# Patient Record
Sex: Male | Born: 1985 | Race: White | Hispanic: Yes | Marital: Married
Health system: Southern US, Community
[De-identification: ages and names within clinical notes are randomized; demographics above are authoritative.]

---

## 2020-07-18 ENCOUNTER — Emergency Department (HOSPITAL_COMMUNITY)
Admission: EM | Admit: 2020-07-18 | Discharge: 2020-07-19 | Disposition: A | Discharge status: Home / Self Care | Payer: Self-pay | Attending: Emergency Medicine | Admitting: Emergency Medicine

## 2020-07-18 ENCOUNTER — Encounter (HOSPITAL_COMMUNITY): Payer: Self-pay | Admitting: Emergency Medicine

## 2020-07-18 ENCOUNTER — Other Ambulatory Visit: Payer: Self-pay

## 2020-07-18 DIAGNOSIS — N23 Unspecified renal colic: Secondary | ICD-10-CM

## 2020-07-18 DIAGNOSIS — N201 Calculus of ureter: Secondary | ICD-10-CM | POA: Insufficient documentation

## 2020-07-18 DIAGNOSIS — N1330 Unspecified hydronephrosis: Secondary | ICD-10-CM | POA: Insufficient documentation

## 2020-07-18 LAB — LIPASE, BLOOD: Lipase: 20 U/L (ref 11–51)

## 2020-07-18 LAB — COMPREHENSIVE METABOLIC PANEL
ALT: 34 U/L (ref 0–44)
AST: 25 U/L (ref 15–41)
Albumin: 4.1 g/dL (ref 3.5–5.0)
Alkaline Phosphatase: 77 U/L (ref 38–126)
Anion gap: 13 (ref 5–15)
BUN: 15 mg/dL (ref 6–20)
CO2: 24 mmol/L (ref 22–32)
Calcium: 9.8 mg/dL (ref 8.9–10.3)
Chloride: 103 mmol/L (ref 98–111)
Creatinine, Ser: 1.25 mg/dL — ABNORMAL HIGH (ref 0.61–1.24)
GFR calc Af Amer: >60 mL/min (ref >60)
GFR calc non Af Amer: >60 mL/min (ref >60)
Glucose, Bld: 111 mg/dL — ABNORMAL HIGH (ref 70–99)
Potassium: 4 mmol/L (ref 3.5–5.1)
Sodium: 140 mmol/L (ref 135–145)
Total Bilirubin: 0.6 mg/dL (ref 0.3–1.2)
Total Protein: 7.5 g/dL (ref 6.5–8.1)

## 2020-07-18 LAB — CBC
HCT: 43.5 % (ref 39.0–52.0)
Hemoglobin: 14.2 g/dL (ref 13.0–17.0)
MCH: 29.2 pg (ref 26.0–34.0)
MCHC: 32.6 g/dL (ref 30.0–36.0)
MCV: 89.3 fL (ref 80.0–100.0)
Platelets: 329 10*3/uL (ref 150–400)
RBC: 4.87 MIL/uL (ref 4.22–5.81)
RDW: 13.3 % (ref 11.5–15.5)
WBC: 13.1 10*3/uL — ABNORMAL HIGH (ref 4.0–10.5)
nRBC: 0 % (ref 0.0–0.2)

## 2020-07-18 NOTE — ED Triage Notes (Signed)
Pt reports right sided abdominal pain X2 days.  Pt also reports painful urination, denies n/v/d/fevers.

## 2020-07-19 ENCOUNTER — Emergency Department (HOSPITAL_COMMUNITY): Payer: Self-pay

## 2020-07-19 LAB — URINALYSIS, ROUTINE W REFLEX MICROSCOPIC
Bilirubin Urine: NEGATIVE
Glucose, UA: 50 mg/dL — AB
Hgb urine dipstick: NEGATIVE
Ketones, ur: NEGATIVE mg/dL
Leukocytes,Ua: NEGATIVE
Nitrite: NEGATIVE
Protein, ur: NEGATIVE mg/dL
Specific Gravity, Urine: 1.028 (ref 1.005–1.030)
pH: 5 (ref 5.0–8.0)

## 2020-07-19 MED ORDER — TAMSULOSIN HCL 0.4 MG PO CAPS: 0.4000 mg | ORAL_CAPSULE | Freq: Every day | ORAL | 0 refills | Status: AC

## 2020-07-19 MED ORDER — IBUPROFEN 600 MG PO TABS: 600.0000 mg | ORAL_TABLET | Freq: Four times a day (QID) | ORAL | 0 refills | Status: AC | PRN

## 2020-07-19 MED ORDER — OXYCODONE-ACETAMINOPHEN 5-325 MG PO TABS: 1.0000 | ORAL_TABLET | Freq: Four times a day (QID) | ORAL | 0 refills | Status: AC | PRN

## 2020-07-19 MED ORDER — OXYCODONE-ACETAMINOPHEN 5-325 MG PO TABS
1.0000 | ORAL_TABLET | Freq: Once | ORAL | Status: AC
  Administered 2020-07-19: 1 via ORAL
  Filled 2020-07-19: qty 1

## 2020-07-19 NOTE — Discharge Instructions (Addendum)
Please read and follow all provided instructions.  Your diagnoses today include:  1. Ureteral colic   2. Right ureteral stone     Tests performed today include:  Urine test that did not show blood in your urine and no infection  CT scan which showed a 3 millimeter kidney on the right side  Blood test that showed just slightly weak kidney function  Vital signs. See below for your results today.   Medications prescribed:   Percocet (oxycodone/acetaminophen) - narcotic pain medication  DO NOT drive or perform any activities that require you to be awake and alert because this medicine can make you drowsy. BE VERY CAREFUL not to take multiple medicines containing Tylenol (also called acetaminophen). Doing so can lead to an overdose which can damage your liver and cause liver failure and possibly death.   Ibuprofen (Motrin, Advil) - anti-inflammatory pain medication  Do not exceed 600mg  ibuprofen every 6 hours, take with food  You have been prescribed an anti-inflammatory medication or NSAID. Take with food. Take smallest effective dose for the shortest duration needed for your pain. Stop taking if you experience stomach pain or vomiting.    Flomax (tamsulosin) - relaxes smooth muscle to help kidney stones pass  Take any prescribed medications only as directed.  Home care instructions:  Follow any educational materials contained in this packet.  Please double your fluid intake for the next several days. Strain your urine and save any stones that may pass.   BE VERY CAREFUL not to take multiple medicines containing Tylenol (also called acetaminophen). Doing so can lead to an overdose which can damage your liver and cause liver failure and possibly death.   Follow-up instructions: Please follow-up with your urologist or the urologist referral (provided on front page) in the next 1 week for further evaluation of your symptoms.  Return instructions:  If you need to return to the  Emergency Department, go to [facility] and not [facility]. The urologists are located at [facility] and can better care for you at this location.   Please return to the Emergency Department if you experience worsening symptoms.  Please return if you develop fever or uncontrolled pain or vomiting.  Please return if you have any other emergent concerns.  Additional Information:  Your vital signs today were: BP (!) 143/81 (BP Location: Left Arm)   Pulse 82   Temp 98.3 F (36.8 C) (Oral)   Resp 14   SpO2 99%  If your blood pressure (BP) was elevated above 135/85 this visit, please have this repeated by your doctor within one month. --------------

## 2020-07-19 NOTE — ED Provider Notes (Signed)
[facility] EMERGENCY DEPARTMENT Provider Note   CSN: [id number] Arrival date & time: 07/18/20  2004     History Chief Complaint  Patient presents with  . Abdominal Pain    Thomas Vincent is a 34 y.o. male.  Patient with no significant PMH or surgical history -- presents with a chief complaint of right-sided abdominal pain. Pt states the pain began on Sunday afternoon (today is Tuesday) and has been bothering him consistently ever since its onset. The pain is described as sharp with radiation to the right inguinal region and testicle. He also complains of some mild pain with urination and states his urine has fluctuated between a clear and a yellow color. He has taken acetaminophen for pain relief, which helps temporarily, but the pain returns shortly after. He does not use any other medications. Denies frequency, urgency, penile discharge, recent sexual activity, diarrhea, constipation, pain with defecation, N/V, or fever.  No CP or SOB.  Denies injury. Interpretation using video interpreter and wife at bedside.         History reviewed. No pertinent past medical history.  There are no problems to display for this patient.   The histories are not reviewed yet. Please review them in the "History" navigator section and refresh this SmartLink.     History reviewed. No pertinent family history.  Social History   Tobacco Use  . Smoking status: Not on file  Substance Use Topics  . Alcohol use: Not on file  . Drug use: Not on file    Home Medications Prior to Admission medications   Not on File    Allergies    Patient has no known allergies.  Review of Systems   Review of Systems  Constitutional: Negative for fever.  HENT: Negative for rhinorrhea and sore throat.   Eyes: Negative for redness.  Respiratory: Negative for cough.   Cardiovascular: Negative for chest pain.  Gastrointestinal: Positive for abdominal pain. Negative for diarrhea, nausea  and vomiting.  Genitourinary: Positive for flank pain and testicular pain. Negative for dysuria and hematuria.  Musculoskeletal: Negative for myalgias.  Skin: Negative for rash.  Neurological: Negative for headaches.    Physical Exam Updated Vital Signs BP (!) 143/81 (BP Location: Left Arm)   Pulse 82   Temp 98.3 F (36.8 C) (Oral)   Resp 14   SpO2 99%   Physical Exam Vitals and nursing note reviewed.  Constitutional:      Appearance: He is well-developed.  HENT:     Head: Normocephalic and atraumatic.  Eyes:     General:        Right eye: No discharge.        Left eye: No discharge.     Conjunctiva/sclera: Conjunctivae normal.  Cardiovascular:     Rate and Rhythm: Normal rate and regular rhythm.     Heart sounds: Normal heart sounds.  Pulmonary:     Effort: Pulmonary effort is normal.     Breath sounds: Normal breath sounds.  Abdominal:     Palpations: Abdomen is soft.     Tenderness: There is abdominal tenderness (R lower quadrant and R flank) in the right lower quadrant. There is right CVA tenderness. There is no left CVA tenderness, guarding or rebound.  Musculoskeletal:     Cervical back: Normal range of motion and neck supple.  Skin:    General: Skin is warm and dry.  Neurological:     Mental Status: He is alert.  ED Results / Procedures / Treatments   Labs (all labs ordered are listed, but only abnormal results are displayed) Labs Reviewed  COMPREHENSIVE METABOLIC PANEL - Abnormal; Notable for the following components:      Result Value   Glucose, Bld 111 (*)    Creatinine, Ser 1.25 (*)    All other components within normal limits  CBC - Abnormal; Notable for the following components:   WBC 13.1 (*)    All other components within normal limits  URINALYSIS, ROUTINE W REFLEX MICROSCOPIC - Abnormal; Notable for the following components:   Glucose, UA 50 (*)    All other components within normal limits  URINE CULTURE  LIPASE, BLOOD    EKG   None  Radiology CT Renal Stone Study  Result Date: 07/19/2020 CLINICAL DATA:  Right flank pain 2 days EXAM: CT ABDOMEN AND PELVIS WITHOUT CONTRAST TECHNIQUE: Multidetector CT imaging of the abdomen and pelvis was performed following the standard protocol without IV contrast. COMPARISON:  None. FINDINGS: Lower chest: Mild right lower lobe atelectasis. Left lung base clear. No pleural effusion. Heart size within normal limits. Hepatobiliary: No focal liver abnormality is seen. No gallstones, gallbladder wall thickening, or biliary dilatation. Pancreas: Negative Spleen: Negative Adrenals/Urinary Tract: Mild right hydronephrosis and hydroureter. 3 mm obstructing stone distal right ureter. Normal left kidney. No left renal calculi or obstruction. Bladder wall thickening. Bladder is nearly empty. Stomach/Bowel: Stomach is within normal limits. Appendix appears normal. No evidence of bowel wall thickening, distention, or inflammatory changes. Vascular/Lymphatic: Negative Reproductive: Normal prostate size Other: No free fluid. Musculoskeletal: Negative for hernia. No significant skeletal abnormality. IMPRESSION: 3 mm obstructing stone distal right ureter with mild hydronephrosis and hydroureter. No other renal calculi. Electronically Signed   By: Marlan Palau M.D.   On: 07/19/2020 13:16    Procedures Procedures (including critical care time)  Medications Ordered in ED Medications  oxyCODONE-acetaminophen (PERCOCET/ROXICET) 5-325 MG per tablet 1 tablet (1 tablet Oral Given 07/19/20 1252)    ED Course  I have reviewed the triage vital signs and the nursing notes.  Pertinent labs & imaging results that were available during my care of the patient were reviewed by me and considered in my medical decision making (see chart for details).  Patient seen and examined. Work-up initiated. Medications ordered.   Vital signs reviewed and are as follows: BP (!) 143/81 (BP Location: Left Arm)   Pulse 82    Temp 98.3 F (36.8 C) (Oral)   Resp 14   SpO2 99%   2:07 PM CT reviewed personally.  Patient updated on results using bedside interpreter, wife also notified of results.  Patient with 3 mm right-sided ureteral stone.  Pain is currently well controlled.  Patient is comfortable with discharge to home.   Patient counseled on kidney stone treatment. Urged patient to strain urine and save any stones. Urged urology follow-up and return to [facility] with any complications. Counseled patient to maintain good fluid intake.   Counseled patient on use of Flomax.   Patient counseled on use of narcotic pain medications. Counseled not to combine these medications with others containing tylenol. Urged not to drink alcohol, drive, or perform any other activities that requires focus while taking these medications. The patient verbalizes understanding and agrees with the plan.    MDM Rules/Calculators/A&P                          Patient with kidney stone, pain consistent with  this.  Pain controlled.  He looks well.  Mild elevation in creatinine.  No UTI.  Mild elevation in white blood cell count, likely secondary to pain.  Plan for discharge home.   Final Clinical Impression(s) / ED Diagnoses Final diagnoses:  Ureteral colic  Right ureteral stone    Rx / DC Orders ED Discharge Orders         Ordered    oxyCODONE-acetaminophen (PERCOCET/ROXICET) 5-325 MG tablet  Every 6 hours PRN        07/19/20 1406    ibuprofen (ADVIL) 600 MG tablet  Every 6 hours PRN        07/19/20 1406    tamsulosin (FLOMAX) 0.4 MG CAPS capsule  Daily        07/19/20 1406           Renne Crigler, PA-C 07/19/20 1409    Pricilla Loveless, MD 07/19/20 1540

## 2020-07-20 LAB — URINE CULTURE: Culture: NO GROWTH

## 2021-03-12 IMAGING — CT CT RENAL STONE PROTOCOL
2 of 3 series · 15 of 42 positions shown, 19 images · non-contrast
Comparison: None.

CLINICAL DATA: Right flank pain 2 days

EXAM:
CT ABDOMEN AND PELVIS WITHOUT CONTRAST
TECHNIQUE: Multidetector CT imaging of the abdomen and pelvis was performed
following the standard protocol without IV contrast.

[Series 3: renal stone 5.0 · axial · 0.98mm/px · z∈[+909,+1334]mm · 12 of 99 slices shown, 16 images]
[im 9/99  soft-tissue]
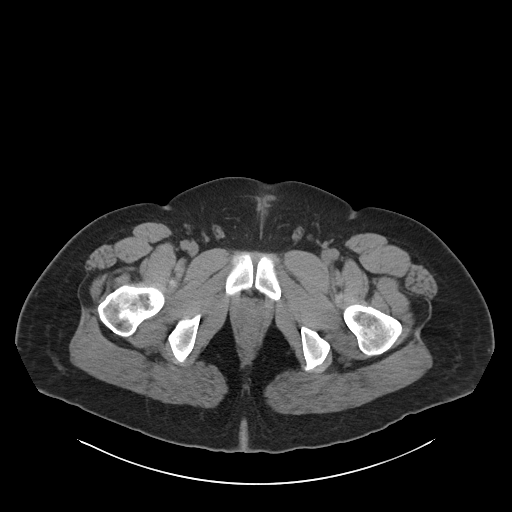
[im 9/99  bone]
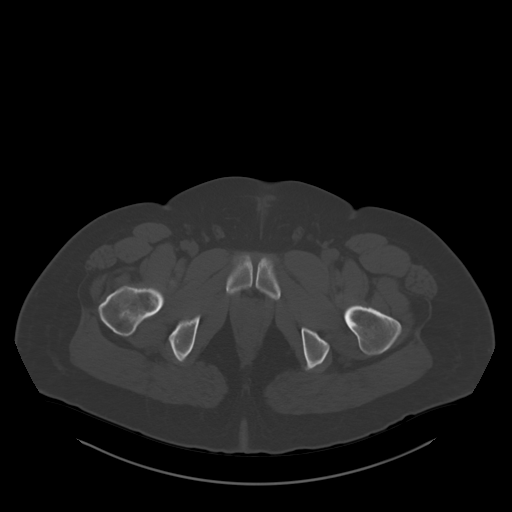
[im 17/99  soft-tissue]
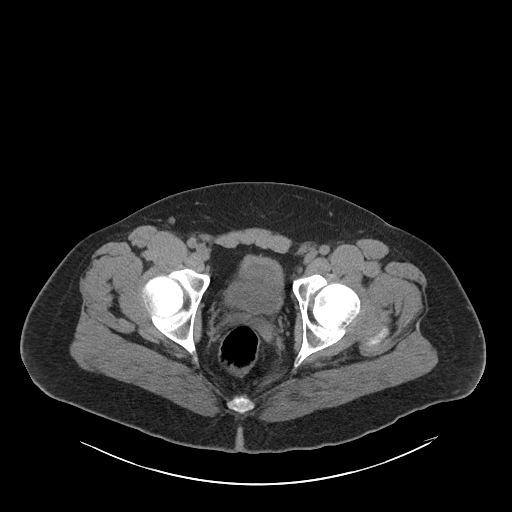
[im 25/99  soft-tissue]
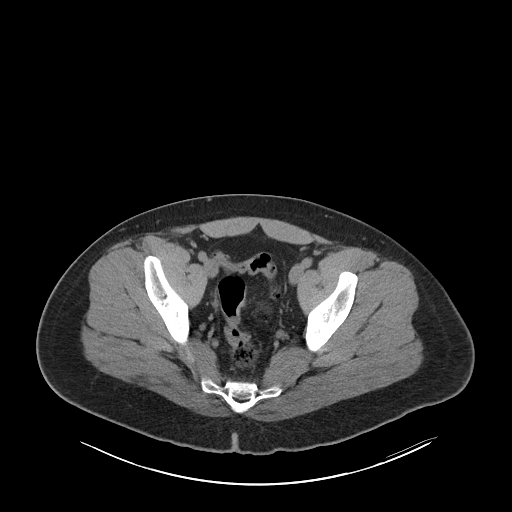
[im 37/99  soft-tissue]
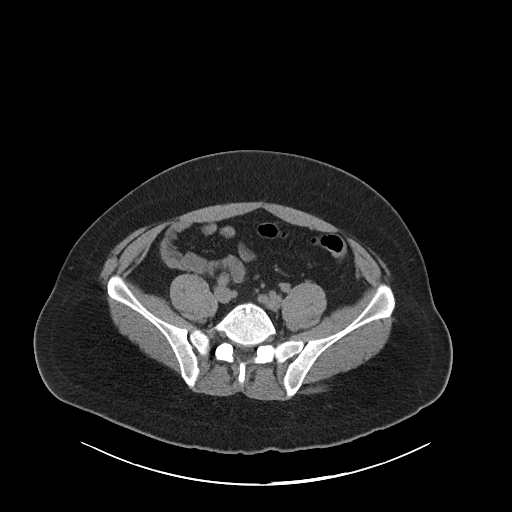
[im 45/99  soft-tissue]
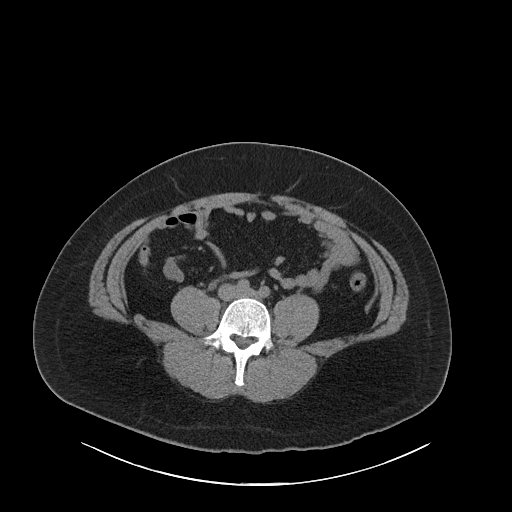
[im 54/99  soft-tissue]
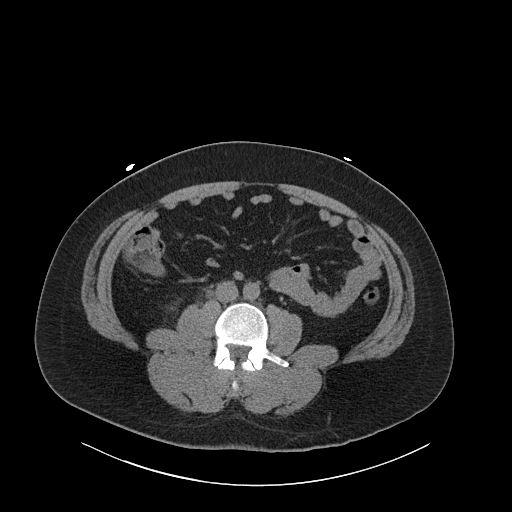
[im 62/99  soft-tissue]
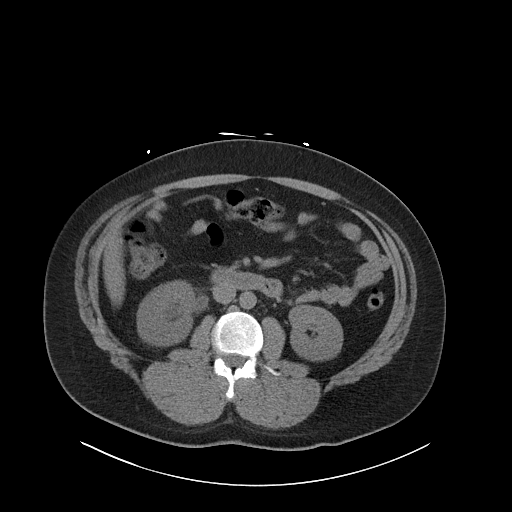
[im 74/99  soft-tissue]
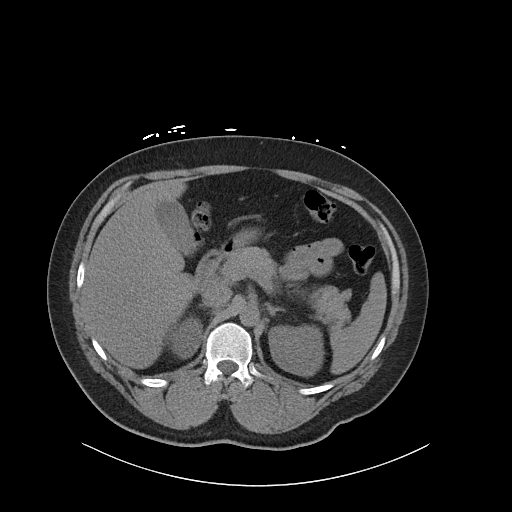
[im 82/99  soft-tissue]
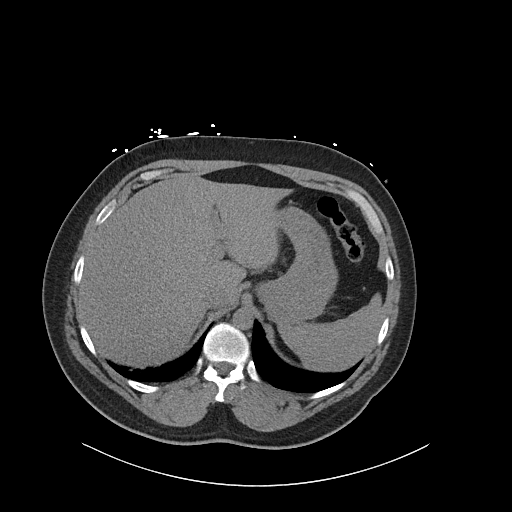
[im 82/99  lung]
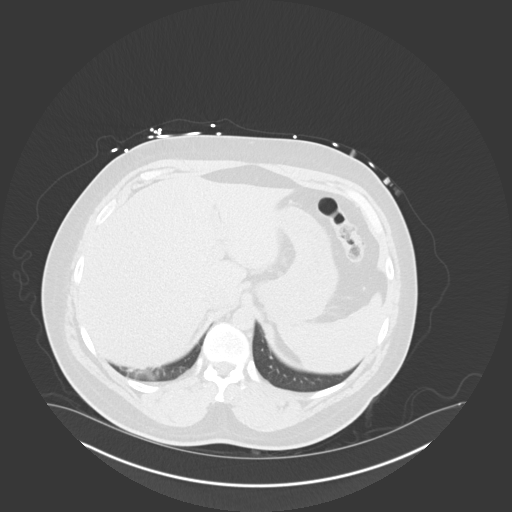
[im 82/99  bone]
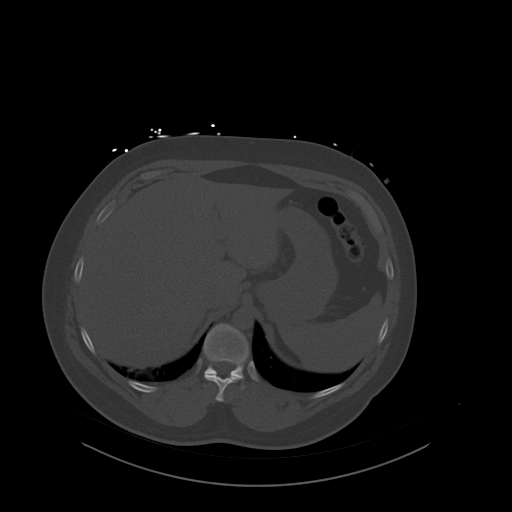
[im 86/99  lung]
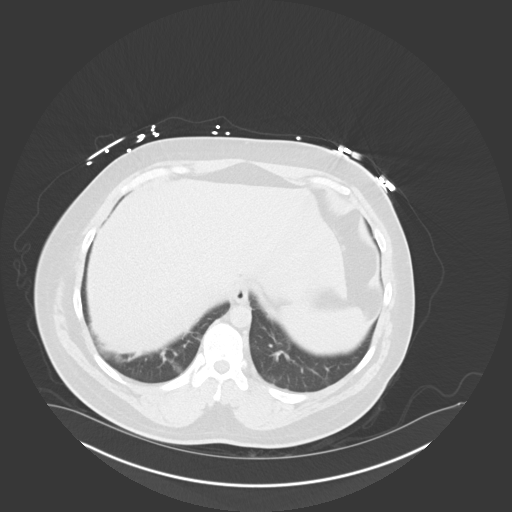
[im 90/99  soft-tissue]
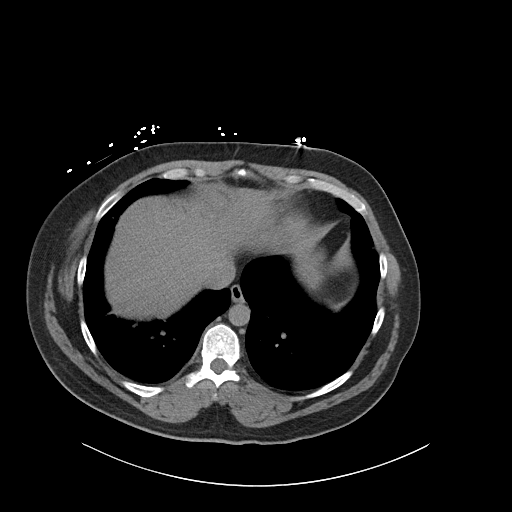
[im 90/99  lung]
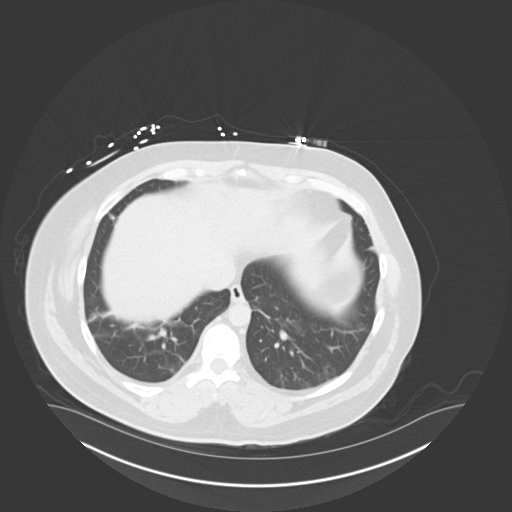
[im 94/99  lung]
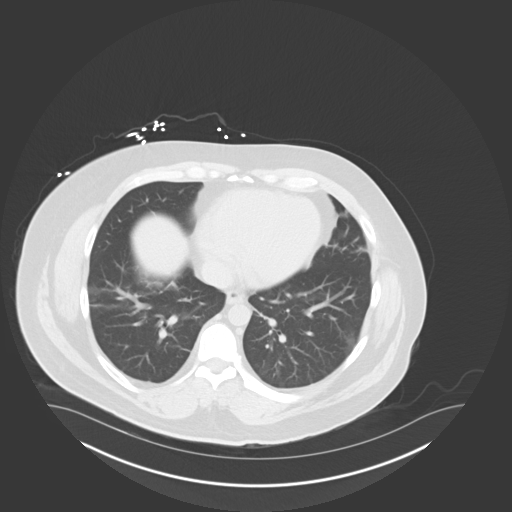

[Series 6: coronal · coronal · 0.87mm/px · 3 of 101 slices shown]
[im 34/101  soft-tissue]
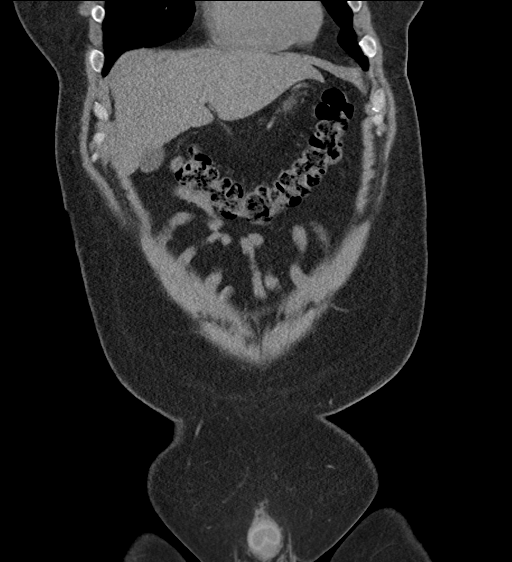
[im 45/101  soft-tissue]
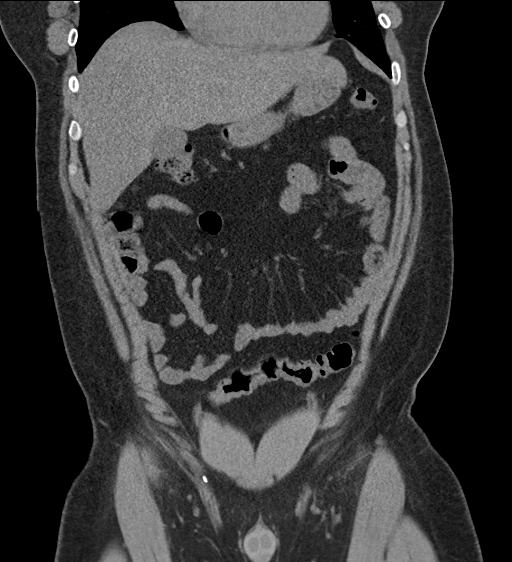
[im 56/101  soft-tissue]
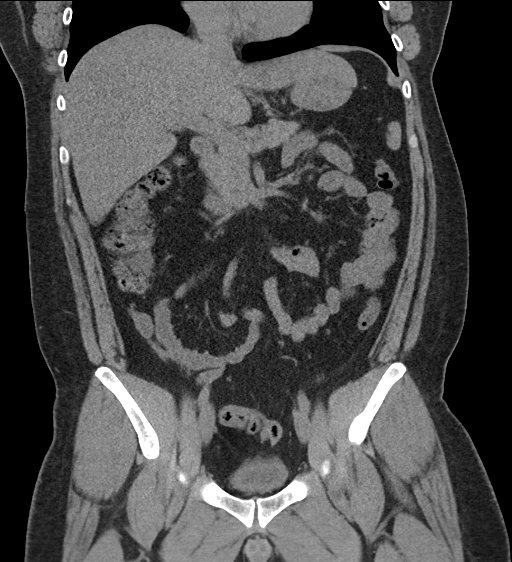

[15 of 42 positions shown; findings below may reference images not displayed]

FINDINGS: Lower chest: Mild right lower lobe atelectasis. Left lung base
clear. No pleural effusion. Heart size within normal limits.

Hepatobiliary: No focal liver abnormality is seen. No gallstones,
gallbladder wall thickening, or biliary dilatation.

Pancreas: Negative

Spleen: Negative

Adrenals/Urinary Tract: Mild right hydronephrosis and hydroureter. 3
mm obstructing stone distal right ureter.

Normal left kidney. No left renal calculi or obstruction. Bladder
wall thickening. Bladder is nearly empty.

Stomach/Bowel: Stomach is within normal limits. Appendix appears
normal. No evidence of bowel wall thickening, distention, or
inflammatory changes.

Vascular/Lymphatic: Negative

Reproductive: Normal prostate size

Other: No free fluid.

Musculoskeletal: Negative for hernia. No significant skeletal
abnormality.
IMPRESSION: 3 mm obstructing stone distal right ureter with mild hydronephrosis
and hydroureter. No other renal calculi.
